# Patient Record
Sex: Female | Born: 1970 | Race: Black or African American | Hispanic: No | Marital: Single | Smoking: Never smoker
Health system: Southern US, Community
[De-identification: ages and names within clinical notes are randomized; demographics above are authoritative.]

## PROBLEM LIST (undated history)

## (undated) DIAGNOSIS — E039 Hypothyroidism, unspecified: Secondary | ICD-10-CM

## (undated) HISTORY — DX: Hypothyroidism, unspecified: E03.9

---

## 1998-12-19 ENCOUNTER — Other Ambulatory Visit: Admission: RE | Admit: 1998-12-19 | Discharge: 1998-12-19 | Payer: Self-pay | Admitting: Family Medicine

## 1999-01-01 ENCOUNTER — Ambulatory Visit (HOSPITAL_COMMUNITY): Admission: RE | Admit: 1999-01-01 | Discharge: 1999-01-01 | Payer: Self-pay | Admitting: Family Medicine

## 1999-01-08 ENCOUNTER — Ambulatory Visit (HOSPITAL_COMMUNITY): Admission: RE | Admit: 1999-01-08 | Discharge: 1999-01-08 | Payer: Self-pay | Admitting: Family Medicine

## 1999-01-09 ENCOUNTER — Encounter: Payer: Self-pay | Admitting: Family Medicine

## 1999-03-20 ENCOUNTER — Ambulatory Visit (HOSPITAL_COMMUNITY): Admission: RE | Admit: 1999-03-20 | Discharge: 1999-03-20 | Payer: Self-pay | Admitting: Family Medicine

## 1999-03-20 ENCOUNTER — Encounter: Payer: Self-pay | Admitting: Family Medicine

## 2000-05-28 ENCOUNTER — Emergency Department (HOSPITAL_COMMUNITY): Admission: EM | Admit: 2000-05-28 | Discharge: 2000-05-28 | Payer: Self-pay | Admitting: Emergency Medicine

## 2001-11-28 ENCOUNTER — Emergency Department (HOSPITAL_COMMUNITY): Admission: EM | Admit: 2001-11-28 | Discharge: 2001-11-28 | Payer: Self-pay | Admitting: Emergency Medicine

## 2001-11-28 ENCOUNTER — Encounter: Payer: Self-pay | Admitting: Emergency Medicine

## 2001-12-11 ENCOUNTER — Encounter (INDEPENDENT_AMBULATORY_CARE_PROVIDER_SITE_OTHER): Payer: Self-pay | Admitting: *Deleted

## 2001-12-11 ENCOUNTER — Inpatient Hospital Stay (HOSPITAL_COMMUNITY): Admission: RE | Admit: 2001-12-11 | Discharge: 2001-12-12 | Payer: Self-pay | Admitting: General Surgery

## 2009-03-20 ENCOUNTER — Emergency Department (HOSPITAL_COMMUNITY): Admission: EM | Admit: 2009-03-20 | Discharge: 2009-03-20 | Payer: Self-pay | Admitting: Family Medicine

## 2010-09-07 NOTE — H&P (Signed)
Michelle Soto, Michelle Soto                         ACCOUNT NO.:  [id number]   MEDICAL RECORD NO.:  [id number]                   PATIENT TYPE:  INP   LOCATION:  5727                                 FACILITY:  [facility]   PHYSICIAN:  Marta Lamas. Gae Bon, M.D.            DATE OF BIRTH:  1970-08-27   DATE OF ADMISSION:  12/11/2001  DATE OF DISCHARGE:  12/12/2001                                HISTORY & PHYSICAL   CHIEF COMPLAINT:  The patient is a 40 year old with symptomatic  cholelithiasis.   HISTORY OF PRESENT ILLNESS:  I saw the patient in my office on December 01, 2001 after a recent visit to the emergency room at [facility] where  she came in with severe right upper quadrant pain and epigastric pain which  happened after eating some fried chicken and rice as meals.  The pain  persisted for a while but abated after she was given pain medication.  Her  subsequent ultrasound demonstrated cholelithiasis and since that time the  patient has modified her diet so that she has no recurrent symptoms.  She  reports no fevers or chills but she has had nausea and vomiting.  She has  had no diarrhea, no apparent jaundice, no constipation.   PAST MEDICAL HISTORY:  Her past history is significant only for  hypothyroidism for which she takes Synthroid.   PAST SURGICAL HISTORY:  She has had a tubal ligation in the past and she is  a gravida 3, para 3.   MEDICATIONS:  Medications include Synthroid and she was also taking  hydrocodone for her recent pain.   ALLERGIES:  She has no known drug allergies.   REVIEW OF SYSTEMS:  No diarrhea, no constipation, no jaundice.  No chest  pain, no shortness of breath, no dyspnea on exertion.   PHYSICAL EXAMINATION:  GENERAL: She is a well-nourished moderately  overweight woman in no acute distress.  VITAL SIGNS: Her blood pressure is 150/110.  HEENT: She is normocephalic and atraumatic and anicteric.  NECK: Her neck is supple without bruits.  CHEST:  Her chest is clear to auscultation.  CARDIAC: Her cardiac exam demonstrates a regular rhythm and rate.  She has  no murmurs, no gallops, no lifts, no heaves.  ABDOMEN: Her abdomen is soft and she has some mild right upper quadrant and  epigastric tenderness to deep palpation but no palpable masses.  RECTAL/PELVIC: Examinations are not performed on this visit, however, she  has had a recent complete examination by primary care.   LABORATORIES:  Laboratories done at [facility] were reported as negative,  however, I do not have a copy of these and we will repeat them prior to  surgical intervention.   IMPRESSION:  Symptomatic cholelithiasis.  Ultrasound demonstrates, by  reading the report, a nonthickened gallbladder wall with extensive  cholelithiasis.   PLAN:  I believe that she will benefit from  a laparoscopic cholecystectomy.  The risks and benefits of which have been explained to the patient.  We will  schedule her as soon as possible based on my surgical schedule and my on-  call schedule for trauma.                                               Marta Lamas. Gae Bon, M.D.    JOW/MEDQ  D:  12/11/2001  T:  12/14/2001  Job:  [id number]

## 2010-09-07 NOTE — Op Note (Signed)
NAMESONDRA, BLIXT                         ACCOUNT NO.:  [id number]   MEDICAL RECORD NO.:  [id number]                   PATIENT TYPE:  INP   LOCATION:  5727                                 FACILITY:  [facility]   PHYSICIAN:  Marta Lamas. Gae Bon, M.D.            DATE OF BIRTH:  May 28, 1970   DATE OF PROCEDURE:  12/11/2001  DATE OF DISCHARGE:  12/12/2001                                 OPERATIVE REPORT   PREOPERATIVE DIAGNOSES:  Symptomatic cholelithiasis and chronic  cholecystitis.   POSTOPERATIVE DIAGNOSES:  Symptomatic cholelithiasis and chronic  cholecystitis.   OPERATION PERFORMED:  Laparoscopic cholecystectomy.   SURGEON:  Marta Lamas. Lindie Spruce, M.D.   ASSISTANT:  Gabrielle Dare. Janee Morn, MD   ANESTHESIA:  General endotracheal.   ESTIMATED BLOOD LOSS:  Less than 20 cc.   COMPLICATIONS:  None.   CONDITION:  Stable.   INDICATIONS FOR PROCEDURE:  The patient is a 40 year old recently admitted  to the emergency department with abdominal pain in the right upper quadrant.  Ultrasound demonstrated extensive cholelithiasis.  She now comes in for  elective laparoscopic cholecystectomy.   OPERATIVE FINDINGS:  The patient had mostly chronic disease minimal acute  adhesions, normal anatomy.   DESCRIPTION OF PROCEDURE:  The patient was taken to the operating room and  placed on the table in the supine position.  After an adequate endotracheal  anesthetic was administered, the patient was prepped and draped in the usual  sterile manner exposing the midline and the right upper quadrant of the  abdomen.   A superumbilical curvilinear incision was made down into the subcutaneous  tissues.  Once we got down there we could see that the patient had a  paraumbilical hernia which we dissected out and then subsequently used a  Hasson technique to enter into the peritoneal cavity.  We isolated the  fascial split with a #15 blade, bluntly dissected down into the peritoneum  which we had access with  the Veress needle initially, but we were getting  high pressure returns, therefore we went with Hasson technique.   Once we had peritoneal entrance with the blunt technique, we were able to  put in a pursestring suture of 0 Vicryl and then pass a Hasson cannula into  the peritoneal cavity and confirming its position with the laparoscope and  then attached camera and light source.   Once we had good positioning of the Hasson cannula, we were able to pass two  right costal margin 5 mm cannulas and a subxiphoid 11 mm cannula under  direct vision into the peritoneal cavity.  The patient was placed in steeper  reverse Trendelenburg position, the left side was tilted down with  dissection begun.   In spite of the maximal intra-abdominal pressure to 50 mmHg, the patient did  not distend well, possibly partially because of her large size.  This  probably created additional pressure from gravity.  We were able  to dissect  out the gallbladder and retract it towards the right upper quadrant.  We  isolated the peritoneum over the triangle of Calot and hepatoduodenal  triangle and then we were able to isolate out the cystic duct, then the  cystic artery and adequately dissect them out in order to get proximal and  distal clips.  We endo clipped the cystic duct and the cystic artery  proximally and distally with double clips and then subsequently transected  them .  We then dissected out the gallbladder from its bed with minimal  difficulty only entering into the gallbladder at the dome almost at the  completion of the dissection which was limited by the close relationship of  the gallbladder to the anterior abdominal wall.  We were eventually able to  get the gallbladder out and then retrieved it from the peritoneal cavity  using EndoCatch bag which we brought out through the superumbilical fascia.  Once we did this, we were able to ligate off the fascia or close the fascia  using the pursestring  suture.  This held very well, we irrigated with  copious amounts of warm saline in the right upper quadrant.  Approximately  2L were used.  There was no bleeding and only mild bile staining from the  ruptured gallbladder during the dissection.   Once the gallbladder was out, then the cannula was out and the fascia was  closed, we injected 0.25% Marcaine at all sites.  Then we closed the skin  using running subcuticular suture of 4-0 Vicryl.  Sterile dressings were  applied to all incisions.  Sponge, needle and instrument counts were correct  at the conclusion of the case.                                                 Marta Lamas. Gae Bon, M.D.    JOW/MEDQ  D:  12/11/2001  T:  12/14/2001  Job:  [id number]

## 2010-11-12 DIAGNOSIS — IMO0001 Reserved for inherently not codable concepts without codable children: Secondary | ICD-10-CM | POA: Insufficient documentation

## 2010-11-12 DIAGNOSIS — E669 Obesity, unspecified: Secondary | ICD-10-CM | POA: Insufficient documentation

## 2010-11-15 DIAGNOSIS — E559 Vitamin D deficiency, unspecified: Secondary | ICD-10-CM | POA: Insufficient documentation

## 2010-11-15 DIAGNOSIS — E039 Hypothyroidism, unspecified: Secondary | ICD-10-CM | POA: Insufficient documentation

## 2014-05-27 ENCOUNTER — Other Ambulatory Visit: Payer: Self-pay | Admitting: Nurse Practitioner

## 2014-05-27 DIAGNOSIS — Z1231 Encounter for screening mammogram for malignant neoplasm of breast: Secondary | ICD-10-CM

## 2014-06-03 ENCOUNTER — Ambulatory Visit
Admission: RE | Admit: 2014-06-03 | Discharge: 2014-06-03 | Disposition: A | Discharge status: Home / Self Care | Payer: 59 | Source: Ambulatory Visit | Attending: Nurse Practitioner | Admitting: Nurse Practitioner

## 2014-06-03 ENCOUNTER — Encounter (INDEPENDENT_AMBULATORY_CARE_PROVIDER_SITE_OTHER): Payer: Self-pay

## 2014-06-03 DIAGNOSIS — Z1231 Encounter for screening mammogram for malignant neoplasm of breast: Secondary | ICD-10-CM

## 2014-07-08 ENCOUNTER — Encounter: Payer: Self-pay | Admitting: Neurology

## 2014-07-08 ENCOUNTER — Ambulatory Visit (INDEPENDENT_AMBULATORY_CARE_PROVIDER_SITE_OTHER): Payer: 59 | Admitting: Neurology

## 2014-07-08 VITALS — BP 144/80 | HR 78 | Temp 98.2°F | Resp 22 | Ht 63.0 in | Wt 256.1 lb

## 2014-07-08 DIAGNOSIS — H4710 Unspecified papilledema: Secondary | ICD-10-CM

## 2014-07-08 DIAGNOSIS — H0520 Unspecified exophthalmos: Secondary | ICD-10-CM

## 2014-07-08 NOTE — Progress Notes (Addendum)
NEUROLOGY CONSULTATION NOTE  Michelle Soto MRN: [id number] DOB: 1970-09-06  Referring provider: Warden Fillers Primary care provider: Gwenlyn Perking  Reason for consult:  Papilledema - rule out idiopathic intracranial hypertension  HISTORY OF PRESENT ILLNESS: Michelle Soto is a 44 year old right-handed woman with hypothyroidism who presents for increased intracranial pressure.  Records, CT of orbits, MRI of brain and orbits and MRV of brain reviewed.  About 2 or 3 months ago, she began to have trouble with night vision, particularly while driving.  She describes it as blurred vision.  She says she sees fine during the day.  She denies visual obscurations.  She has proptosis and has had 3 occasions where her eyes "popped out of the socket".  She has a history of hypothyroidism but was not taking her synthroid because she did not have insurance for a while.  She denies eye pain.  She reports pulsatile tinnitus.  She has occasional right sided throbbing headache, which is not positional, but it is not much of a problem now.  She was examined by Dr. Katy Fitch, an ophthalmologist, who noted bilateral proptosis with mild optic disc edema.  She had a CT of the orbits performed on 06/20/14, which was normal.  MRI of the brain and orbits with and without contrast and MRV of head were normal, without evidence of mass lesion, abnormal enhancement or thrombosis.    PAST MEDICAL HISTORY: Past Medical History  Diagnosis Date  . Hypothyroid     PAST SURGICAL HISTORY: No past surgical history on file.  MEDICATIONS: No current outpatient prescriptions on file prior to visit.   No current facility-administered medications on file prior to visit.    ALLERGIES: Not on File  FAMILY HISTORY: Family History  Problem Relation Age of Onset  . Hypertension Father   . Cancer Maternal Grandmother     unknown   . Cancer Maternal Grandfather     unknown     SOCIAL HISTORY: History   Social History    . Marital Status: Single    Spouse Name: N/A  . Number of Children: N/A  . Years of Education: N/A   Occupational History  . Not on file.   Social History Main Topics  . Smoking status: Never Smoker   . Smokeless tobacco: Never Used  . Alcohol Use: 0.0 oz/week    0 Standard drinks or equivalent per week     Comment: socially   . Drug Use: No  . Sexual Activity:    Partners: Male   Other Topics Concern  . Not on file   Social History Narrative  . No narrative on file    REVIEW OF SYSTEMS: Constitutional: No fevers, chills, or sweats, no generalized fatigue, change in appetite Eyes: No visual changes, double vision, eye pain Ear, nose and throat: No hearing loss, ear pain, nasal congestion, sore throat Cardiovascular: No chest pain, palpitations Respiratory:  No shortness of breath at rest or with exertion, wheezes GastrointestinaI: No nausea, vomiting, diarrhea, abdominal pain, fecal incontinence Genitourinary:  No dysuria, urinary retention or frequency Musculoskeletal:  No neck pain, back pain Integumentary: No rash, pruritus, skin lesions Neurological: as above Psychiatric: No depression, insomnia, anxiety Endocrine: No palpitations, fatigue, diaphoresis, mood swings, change in appetite, change in weight, increased thirst Hematologic/Lymphatic:  No anemia, purpura, petechiae. Allergic/Immunologic: no itchy/runny eyes, nasal congestion, recent allergic reactions, rashes  PHYSICAL EXAM: Filed Vitals:   07/08/14 1517  BP: 144/80  Pulse: 78  Temp: 98.2 F (36.8 C)  Resp: 22   General: No acute distress Head:  Normocephalic/atraumatic Eyes:  fundi not able to visualize on exam.  Bilateral proptosis.   Neck: supple, no paraspinal tenderness, full range of motion Back: No paraspinal tenderness Heart: regular rate and rhythm Lungs: Clear to auscultation bilaterally. Vascular: No carotid bruits. Neurological Exam: Mental status: alert and oriented to person,  place, and time, recent and remote memory intact, fund of knowledge intact, attention and concentration intact, speech fluent and not dysarthric, language intact. Cranial nerves: CN I: not tested CN II: pupils equal, round and reactive to light, visual fields intact. CN III, IV, VI:  full range of motion, no nystagmus, no ptosis CN V: facial sensation intact CN VII: upper and lower face symmetric CN VIII: hearing intact CN IX, X: gag intact, uvula midline CN XI: sternocleidomastoid and trapezius muscles intact CN XII: tongue midline Bulk & Tone: normal, no fasciculations. Motor:  5/5 throughout Sensation:  Temperature and vibration intact Deep Tendon Reflexes:  2+ throughout, toes downgoing Finger to nose testing:  No dysmetria Heel to shin:  No dysmetria Gait:  Normal station and stride.  Able to turn and walk in tandem. Romberg negative.  IMPRESSION: Papilledema.  Evaluate for idiopathic intracranial hypertension. Proptosis.  Etiology unknown.  She has an hypothyroidism and not an overactive thyroid.  Not typical symptoms for IIH. Morbid obesity  PLAN: 1. Will get lumbar puncture with opening pressure 2.  If elevated, would start acetazolamide 3.  Advised weight loss 4.  Follow up in 2 months.  Thank you for allowing me to take part in the care of this patient.  Metta Clines, DO  CC:  Warden Fillers  Gwenlyn Perking

## 2014-07-08 NOTE — Addendum Note (Signed)
Addended by: Charyl Bigger E on: 07/08/2014 04:06 PM   Modules accepted: Orders

## 2014-07-08 NOTE — Patient Instructions (Addendum)
1.  We need to send you for a spinal tap to measure the pressure of the spinal fluid.  We will contact you with results.  If it is high, we will start a medication called acetazolamide.   2.  Follow up in 2 months.

## 2014-07-28 ENCOUNTER — Telehealth: Payer: Self-pay | Admitting: Neurology

## 2014-07-28 NOTE — Telephone Encounter (Signed)
Pt called wanting to f/u on the spinal tap order Dr. Tomi Likens was going to send out. Pt has not heard anything regarding setting that up. [phone]

## 2014-07-28 NOTE — Telephone Encounter (Signed)
I spoke with patient and explain orders are in for DG fluro  LP  I  Put in a call to [place] the day I put the order the day he ordered it . Patient states she has not heard anything from [place]  . I gave her the number to call and told her to call me back if she did not get a date

## 2014-08-01 ENCOUNTER — Ambulatory Visit
Admission: RE | Admit: 2014-08-01 | Discharge: 2014-08-01 | Disposition: A | Discharge status: Home / Self Care | Payer: 59 | Source: Ambulatory Visit | Attending: Neurology | Admitting: Neurology

## 2014-08-01 DIAGNOSIS — H0520 Unspecified exophthalmos: Secondary | ICD-10-CM

## 2014-08-01 DIAGNOSIS — H4710 Unspecified papilledema: Secondary | ICD-10-CM

## 2014-08-01 NOTE — Discharge Instructions (Signed)

## 2014-08-02 ENCOUNTER — Other Ambulatory Visit: Payer: Self-pay | Admitting: *Deleted

## 2014-08-02 ENCOUNTER — Telehealth: Payer: Self-pay | Admitting: *Deleted

## 2014-08-02 MED ORDER — ACETAZOLAMIDE 125 MG PO TABS: ORAL_TABLET | ORAL | Status: AC

## 2014-08-02 NOTE — Telephone Encounter (Signed)
Patient is aware of test results Opening pressure of the lumbar puncture was elevated. Therefore, I would start acetazolamide 560m twice daily. Side effects may include numbness and tingling. I would have her eyes re-examined by the eye doctor in 4 weeks. I would like to see her shortly afterwards. Medication was sent to pharmacy

## 2014-08-02 NOTE — Telephone Encounter (Signed)
-----   Message from Pieter Partridge, DO sent at 08/01/2014 12:48 PM EDT ----- Opening pressure of the lumbar puncture was elevated.  Therefore, I would start acetazolamide 573m twice daily.  Side effects may include numbness and tingling.  I would have her eyes re-examined by the eye doctor in 4 weeks.  I would like to see her shortly afterwards. ----- Message -----    From: Rad Results In Interface    Sent: 08/01/2014  11:25 AM      To: APieter Partridge DO

## 2014-09-16 ENCOUNTER — Ambulatory Visit: Payer: 59 | Admitting: Neurology

## 2014-09-23 ENCOUNTER — Ambulatory Visit: Payer: 59 | Admitting: Neurology

## 2014-09-23 DIAGNOSIS — Z029 Encounter for administrative examinations, unspecified: Secondary | ICD-10-CM

## 2014-09-26 ENCOUNTER — Encounter: Payer: Self-pay | Admitting: Neurology

## 2014-11-06 ENCOUNTER — Other Ambulatory Visit: Payer: Self-pay | Admitting: Neurology

## 2016-06-19 IMAGING — MG MM DIGITAL SCREENING BILAT
4 series · 4 of 4 positions shown · non-contrast
Comparison: None.

CLINICAL DATA: Screening.

EXAM:
DIGITAL SCREENING BILATERAL MAMMOGRAM WITH CAD

[L CC]
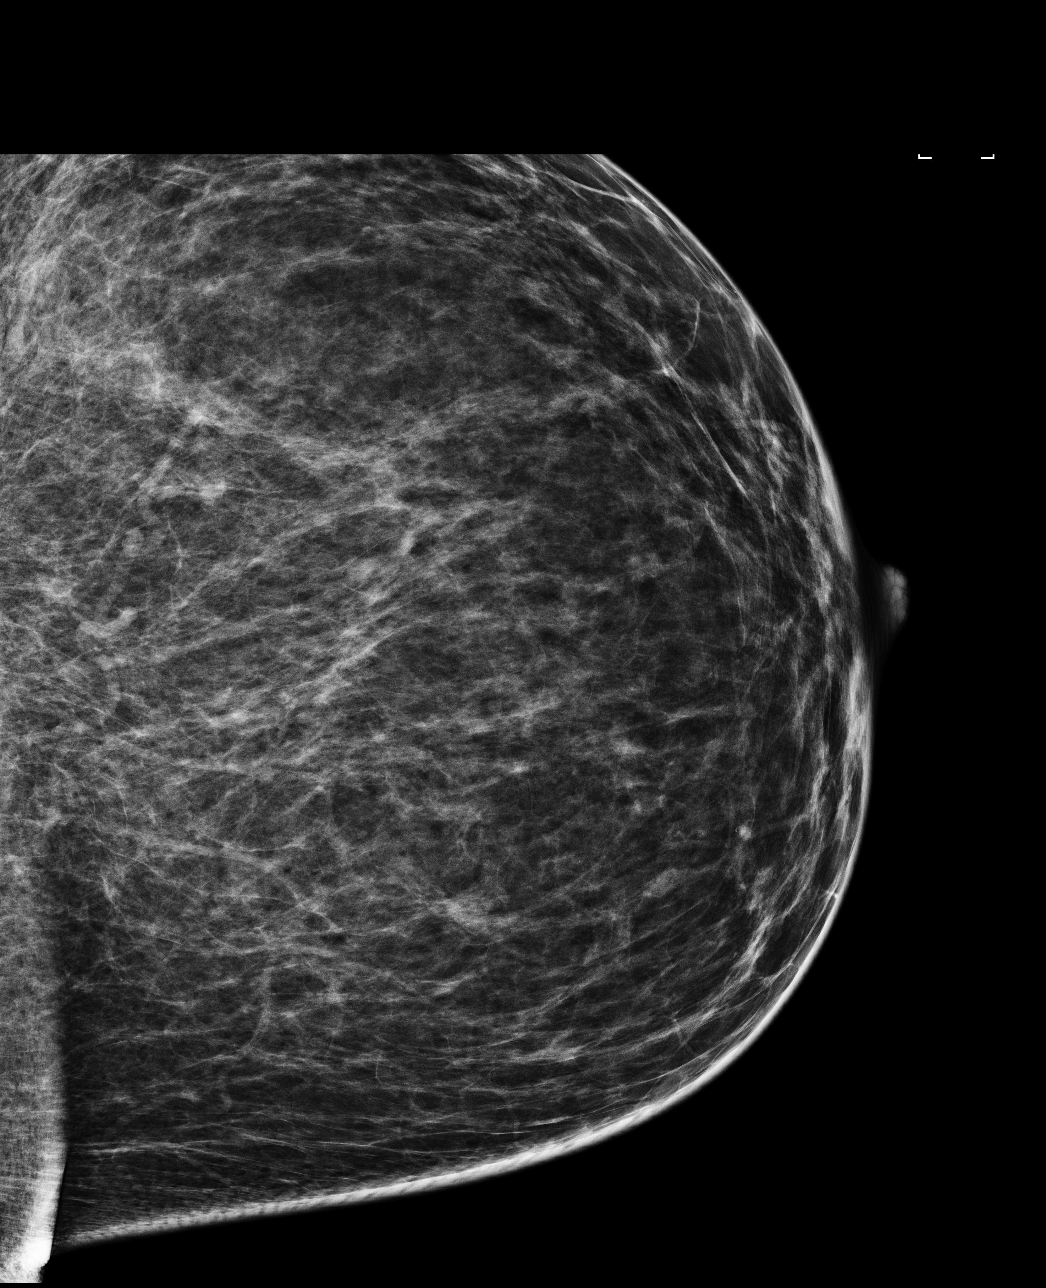

[L MLO]
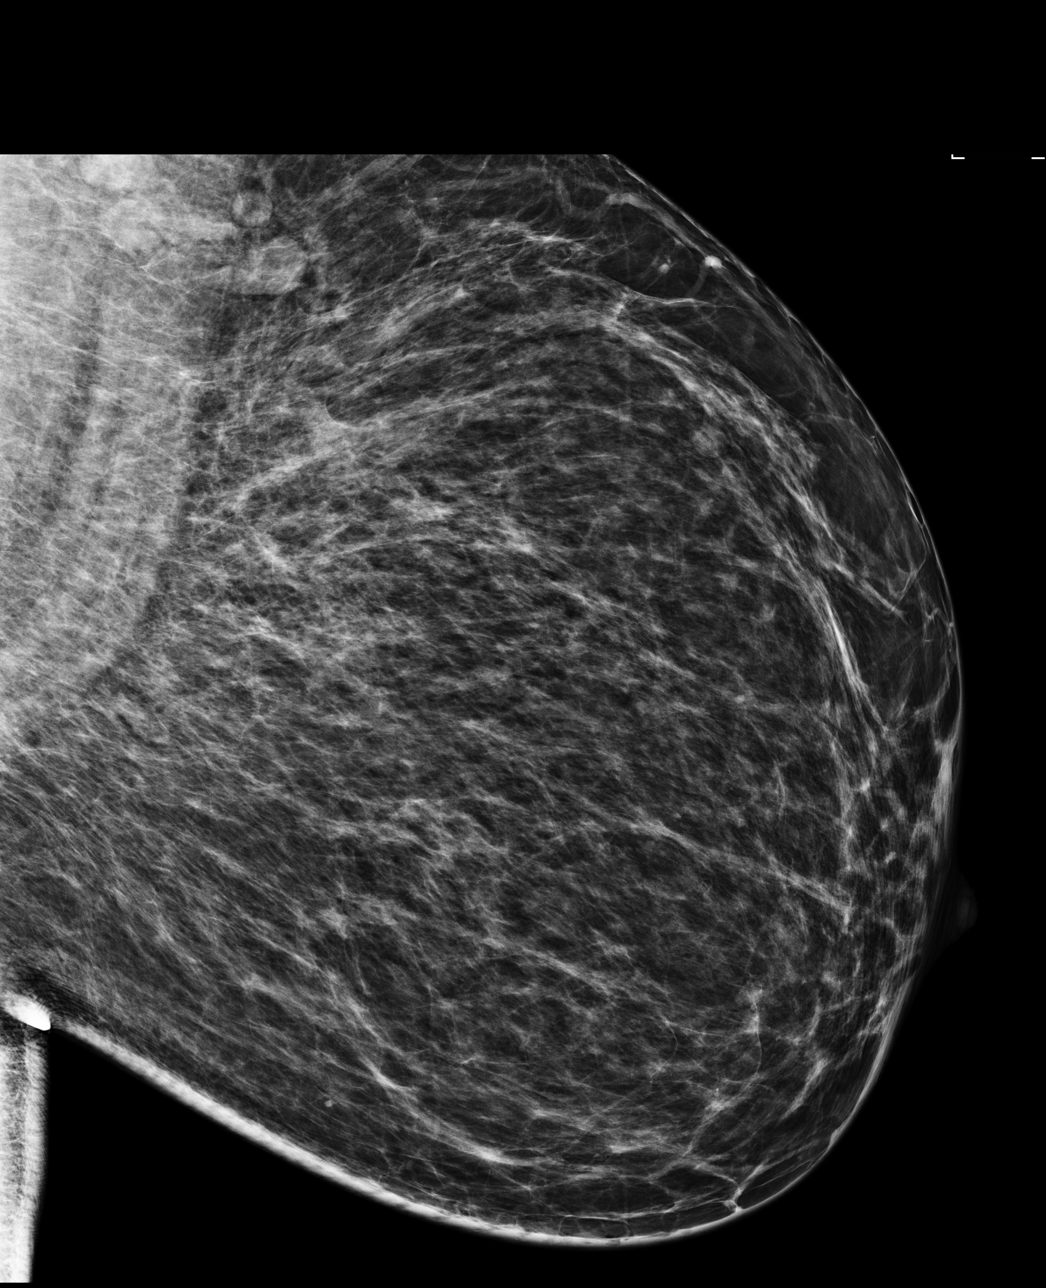

[R CC]
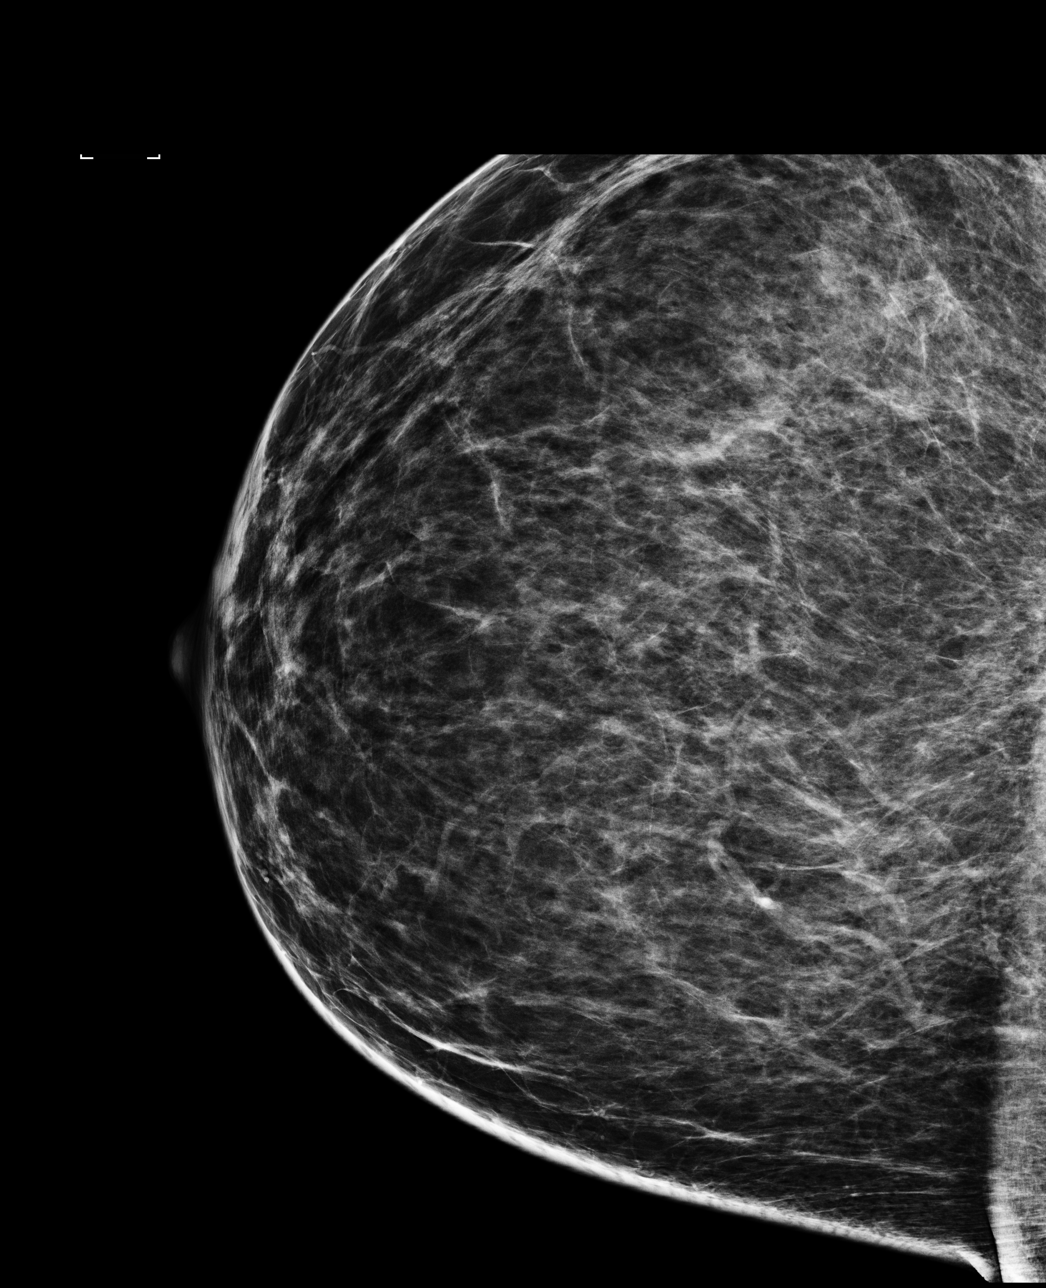

[R MLO]
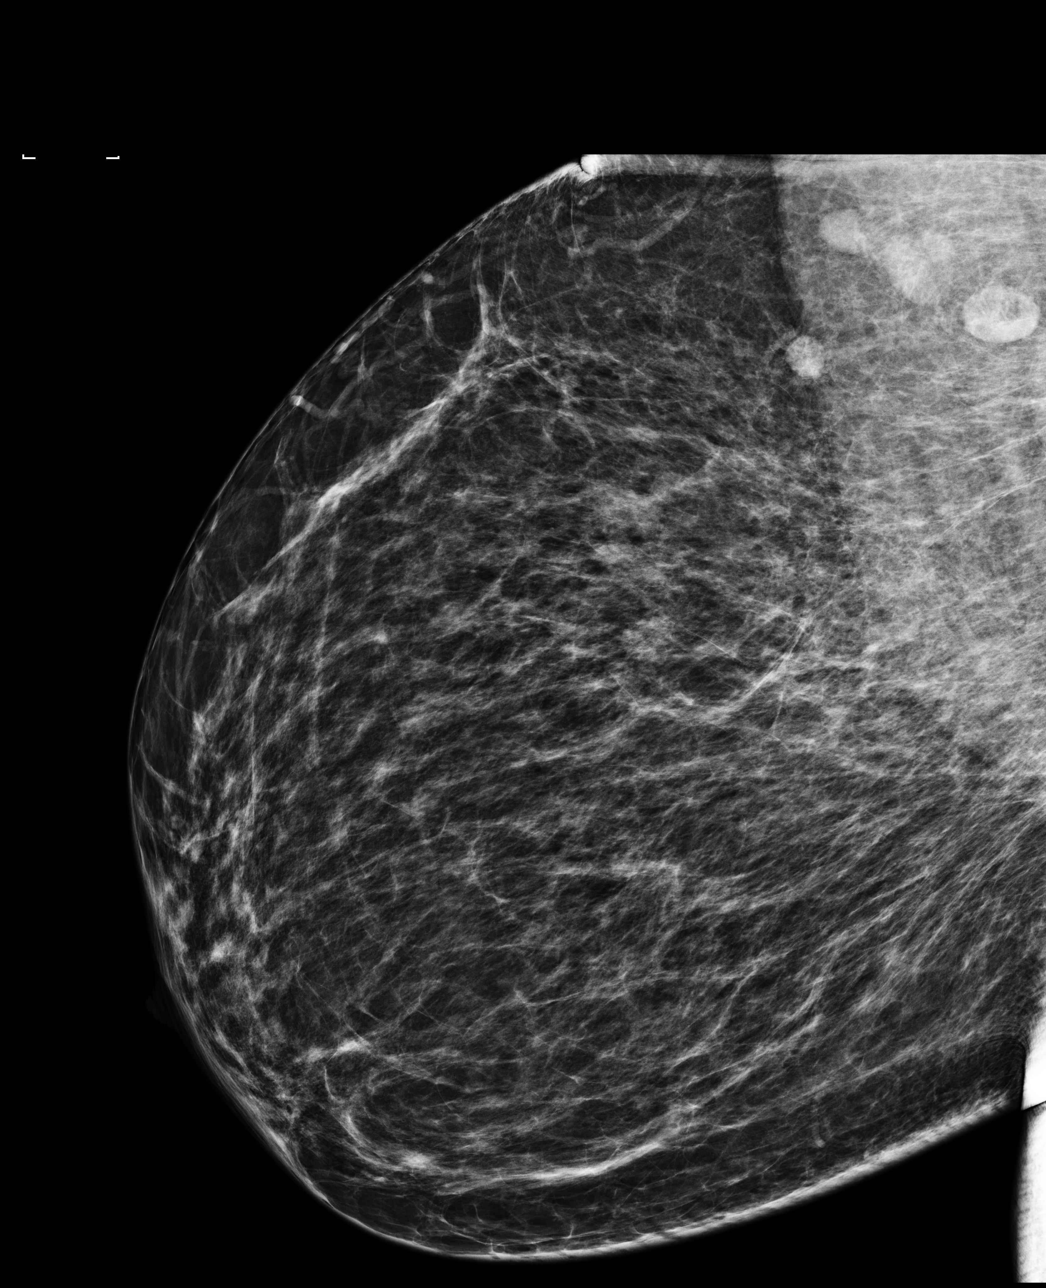

[4 of 4 positions shown; findings below may reference images not displayed]

ACR Breast Density Category c: The breast tissue is heterogeneously
dense, which may obscure small masses
FINDINGS: There are no findings suspicious for malignancy. Images were
processed with CAD.
IMPRESSION: No mammographic evidence of malignancy. A result letter of this
screening mammogram will be mailed directly to the patient.

RECOMMENDATION:
Screening mammogram in one year. (Code:U2-0-761)

BI-RADS CATEGORY  1: Negative.

## 2017-08-06 ENCOUNTER — Other Ambulatory Visit: Payer: Self-pay | Admitting: Nurse Practitioner

## 2017-08-06 DIAGNOSIS — Z1231 Encounter for screening mammogram for malignant neoplasm of breast: Secondary | ICD-10-CM

## 2017-08-14 ENCOUNTER — Ambulatory Visit
Admission: RE | Admit: 2017-08-14 | Discharge: 2017-08-14 | Disposition: A | Discharge status: Home / Self Care | Payer: BLUE CROSS/BLUE SHIELD | Source: Ambulatory Visit | Attending: Nurse Practitioner | Admitting: Nurse Practitioner

## 2017-08-14 DIAGNOSIS — Z1231 Encounter for screening mammogram for malignant neoplasm of breast: Secondary | ICD-10-CM

## 2019-01-25 ENCOUNTER — Other Ambulatory Visit: Payer: Self-pay | Admitting: Physician Assistant

## 2019-01-25 DIAGNOSIS — Z1231 Encounter for screening mammogram for malignant neoplasm of breast: Secondary | ICD-10-CM

## 2019-04-01 ENCOUNTER — Ambulatory Visit: Payer: BLUE CROSS/BLUE SHIELD

## 2019-04-01 ENCOUNTER — Other Ambulatory Visit: Payer: Self-pay

## 2019-07-08 ENCOUNTER — Ambulatory Visit: Payer: Self-pay | Attending: Internal Medicine

## 2019-07-08 DIAGNOSIS — Z23 Encounter for immunization: Secondary | ICD-10-CM

## 2019-07-08 NOTE — Progress Notes (Signed)
   Covid-19 Vaccination Clinic  Name:  Michelle Soto    MRN: [id number] DOB: [birth year]  07/08/2019  [name] was observed post Covid-19 immunization for 15 minutes without incident. She was provided with Vaccine Information Sheet and instruction to access the V-Safe system.   [name] was instructed to call 911 with any severe reactions post vaccine: Marland Kitchen Difficulty breathing  . Swelling of face and throat  . A fast heartbeat  . A bad rash all over body  . Dizziness and weakness   Immunizations Administered    Name Date Dose VIS Date Route   Pfizer COVID-19 Vaccine 07/08/2019  9:15 AM 0.3 mL 04/02/2019 Intramuscular   Manufacturer: [place]   Lot: [id number]   [place]: [id number]

## 2019-08-02 ENCOUNTER — Ambulatory Visit: Payer: Self-pay | Attending: Internal Medicine

## 2019-08-02 DIAGNOSIS — Z23 Encounter for immunization: Secondary | ICD-10-CM

## 2019-08-02 NOTE — Progress Notes (Signed)
   Covid-19 Vaccination Clinic  Name:  Michelle Soto    MRN: [id number] DOB: 06-17-1970  08/02/2019  [name] was observed post Covid-19 immunization for 15 minutes without incident. She was provided with Vaccine Information Sheet and instruction to access the V-Safe system.   [name] was instructed to call 911 with any severe reactions post vaccine: Marland Kitchen Difficulty breathing  . Swelling of face and throat  . A fast heartbeat  . A bad rash all over body  . Dizziness and weakness   Immunizations Administered    Name Date Dose VIS Date Route   Pfizer COVID-19 Vaccine 08/02/2019 10:54 AM 0.3 mL 04/02/2019 Intramuscular   Manufacturer: [place]   Lot: [id number]   [place]: [id number]

## 2019-08-31 IMAGING — MG DIGITAL SCREENING BILATERAL MAMMOGRAM WITH TOMO AND CAD
8 series · 8 of 24 positions shown · non-contrast
Comparison: Previous exam(s).

CLINICAL DATA: Screening.

EXAM:
DIGITAL SCREENING BILATERAL MAMMOGRAM WITH TOMO AND CAD

[L MLO synth-2D]
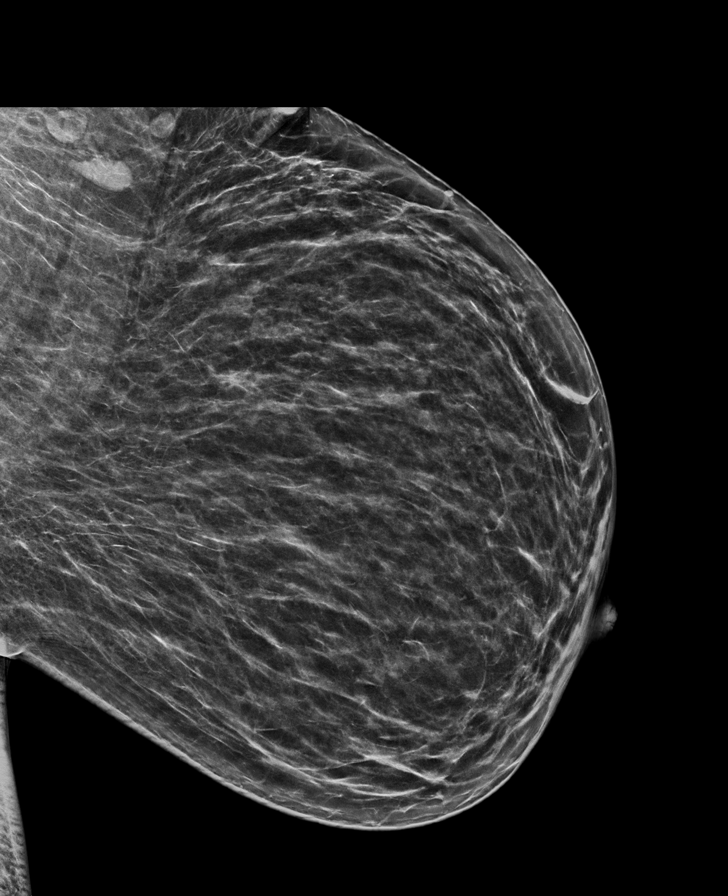

[R CC synth-2D]
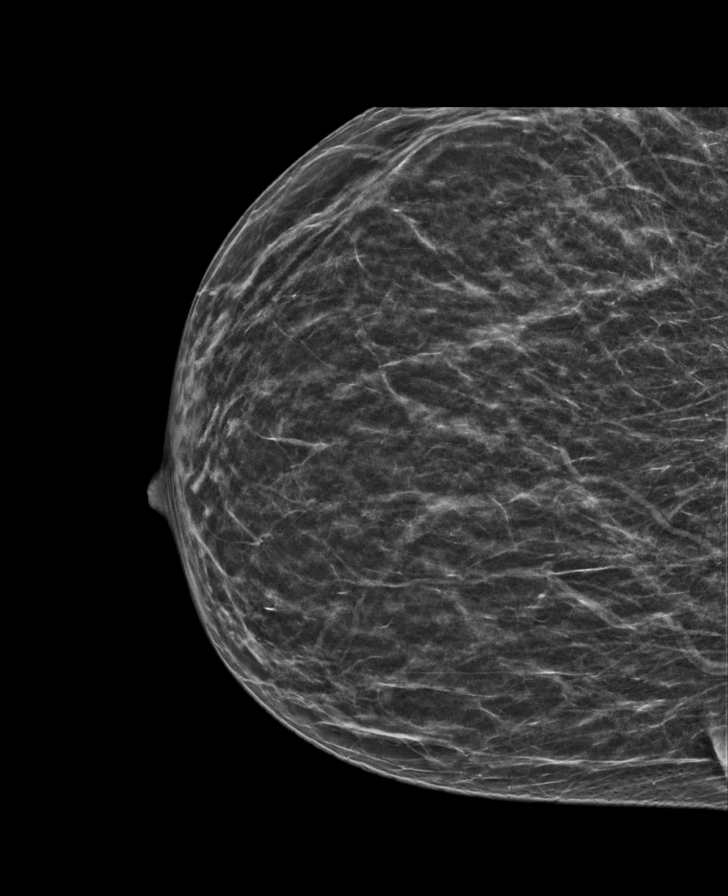

[L CC synth-2D]
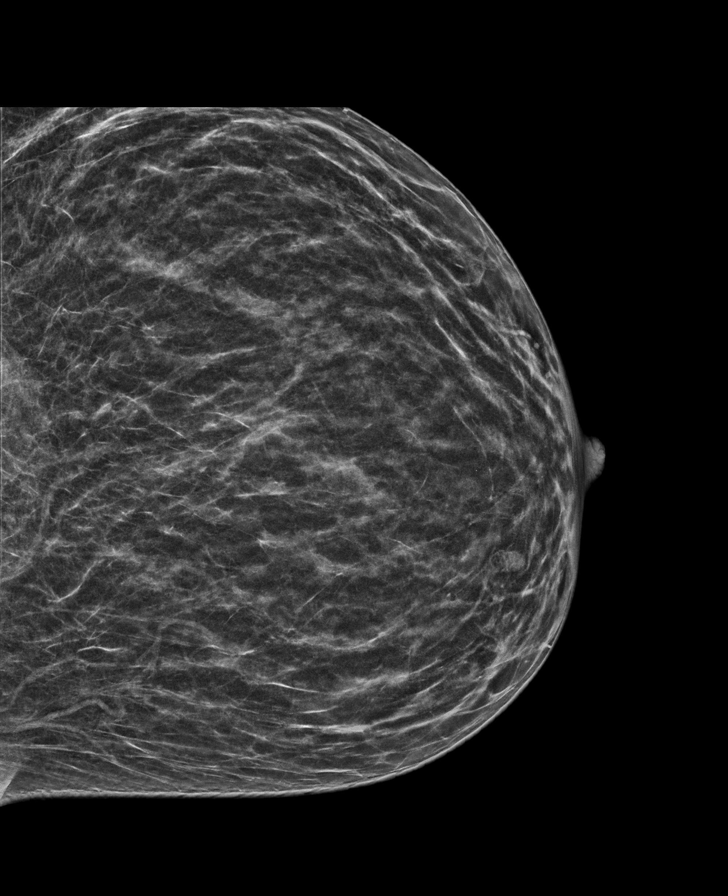

[R MLO synth-2D]
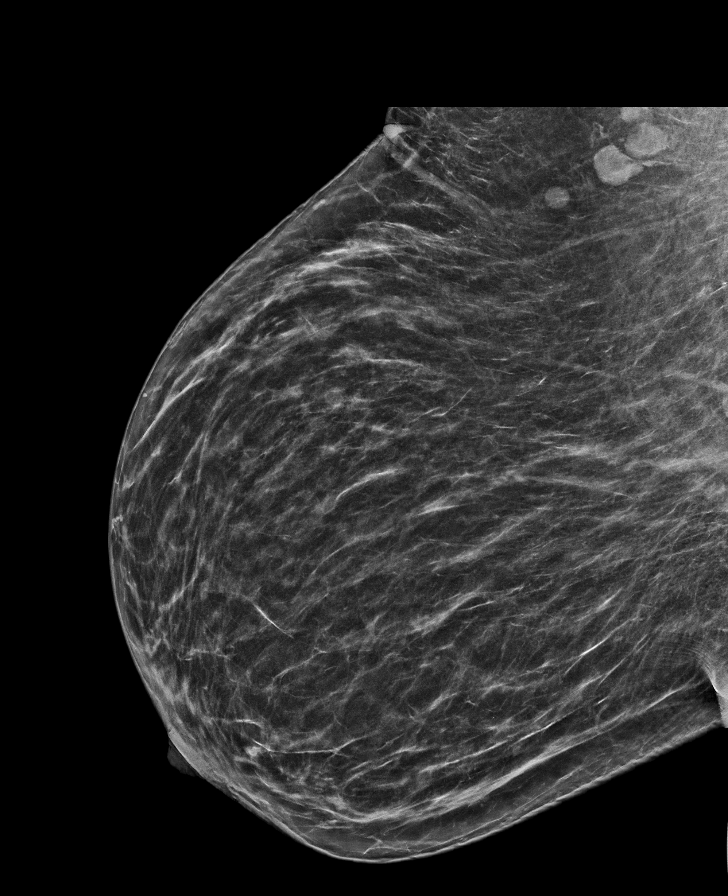

[R MLO tomo · tomo slice 33/64.0]
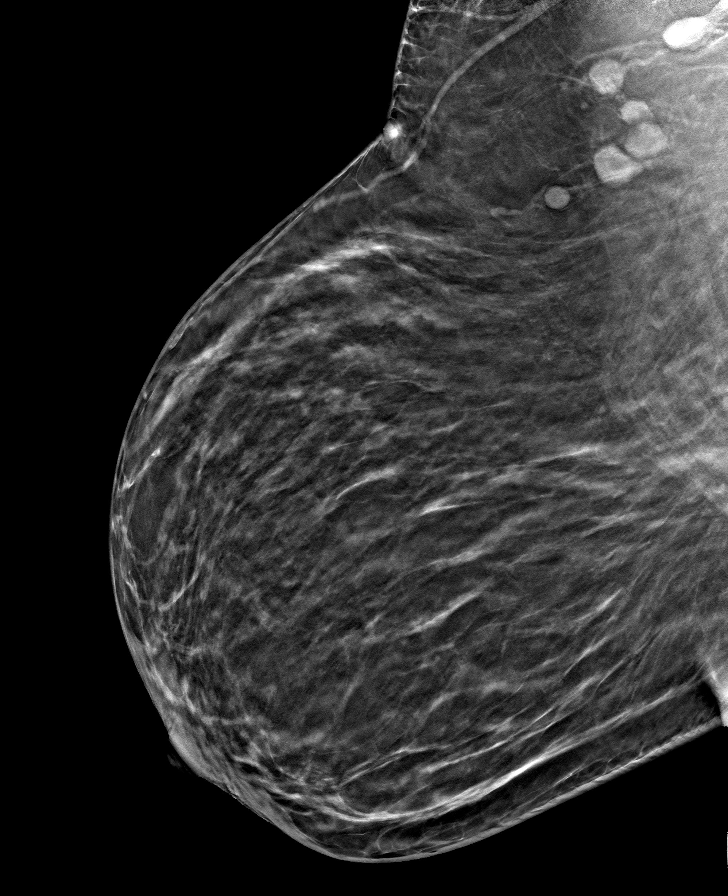

[L CC tomo · tomo slice 27/54.0]
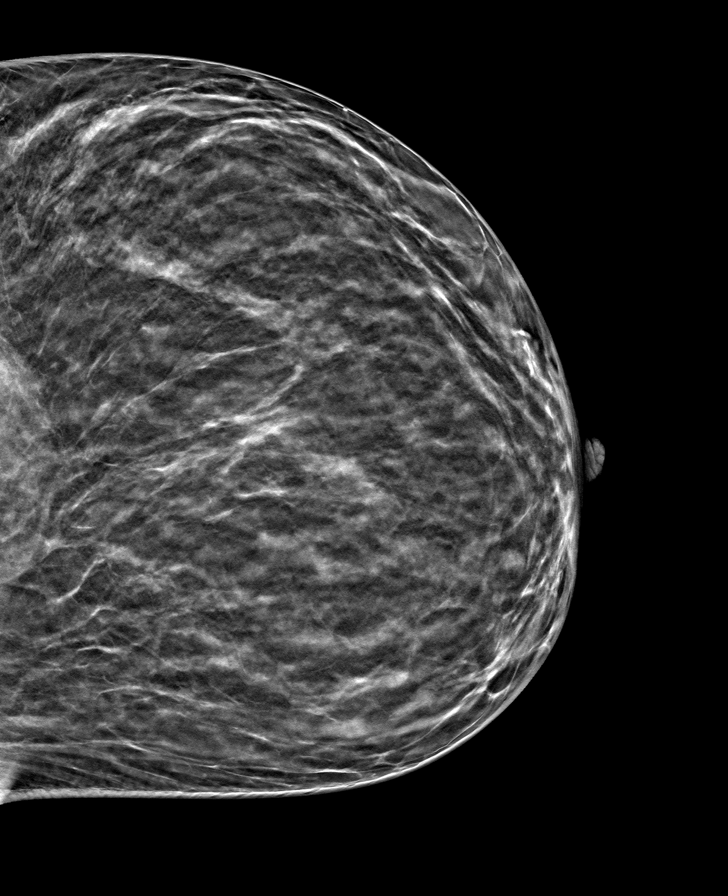

[L MLO tomo · tomo slice 33/64.0]
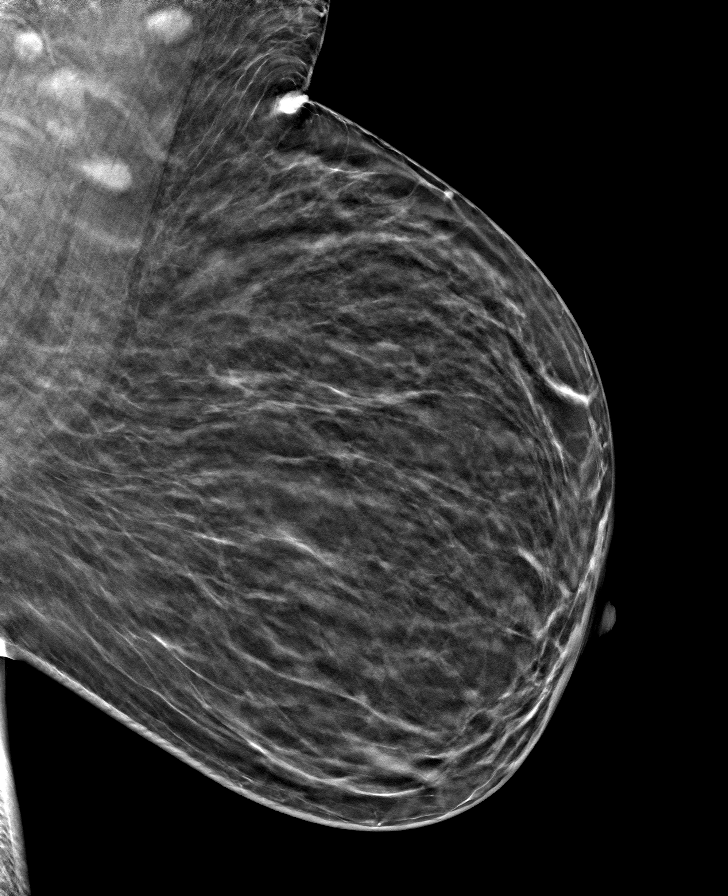

[R CC tomo · tomo slice 25/50.0]
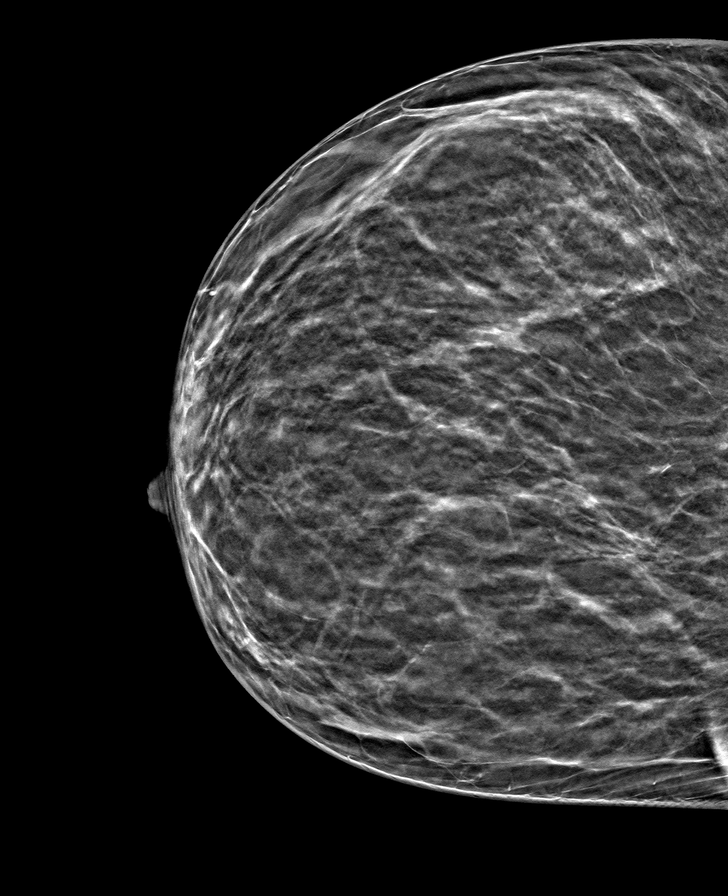

[8 of 24 positions shown; findings below may reference images not displayed]

ACR Breast Density Category b: There are scattered areas of
fibroglandular density.
FINDINGS: There are no findings suspicious for malignancy. Images were
processed with CAD.
IMPRESSION: No mammographic evidence of malignancy. A result letter of this
screening mammogram will be mailed directly to the patient.

RECOMMENDATION:
Screening mammogram in one year. (Code:CN-U-775)

BI-RADS CATEGORY  1: Negative.
